# Patient Record
Sex: Female | Born: 1975 | Race: White | Hispanic: No | Marital: Married
Health system: Southern US, Community
[De-identification: ages and names within clinical notes are randomized; demographics above are authoritative.]

## PROBLEM LIST (undated history)

## (undated) DIAGNOSIS — J302 Other seasonal allergic rhinitis: Secondary | ICD-10-CM

## (undated) DIAGNOSIS — R55 Syncope and collapse: Secondary | ICD-10-CM

## (undated) HISTORY — DX: Syncope and collapse: R55

## (undated) HISTORY — DX: Other seasonal allergic rhinitis: J30.2

---

## 2004-06-22 ENCOUNTER — Other Ambulatory Visit: Admission: RE | Admit: 2004-06-22 | Discharge: 2004-06-22 | Payer: Self-pay | Admitting: Family Medicine

## 2005-07-10 ENCOUNTER — Other Ambulatory Visit: Admission: RE | Admit: 2005-07-10 | Discharge: 2005-07-10 | Payer: Self-pay | Admitting: Family Medicine

## 2007-06-12 ENCOUNTER — Ambulatory Visit (HOSPITAL_COMMUNITY): Admission: RE | Admit: 2007-06-12 | Discharge: 2007-06-12 | Payer: Self-pay | Admitting: Obstetrics and Gynecology

## 2007-07-21 ENCOUNTER — Inpatient Hospital Stay (HOSPITAL_COMMUNITY): Admission: AD | Admit: 2007-07-21 | Discharge: 2007-07-24 | Payer: Self-pay | Admitting: Obstetrics & Gynecology

## 2010-02-17 ENCOUNTER — Inpatient Hospital Stay (HOSPITAL_COMMUNITY): Admission: AD | Admit: 2010-02-17 | Discharge: 2010-02-19 | Payer: Self-pay | Admitting: Obstetrics & Gynecology

## 2010-12-24 LAB — CBC
HCT: 33.7 % — ABNORMAL LOW (ref 36.0–46.0)
Hemoglobin: 11.6 g/dL — ABNORMAL LOW (ref 12.0–15.0)
WBC: 10.2 10*3/uL (ref 4.0–10.5)

## 2010-12-25 LAB — CBC
HCT: 34.8 % — ABNORMAL LOW (ref 36.0–46.0)
Hemoglobin: 12.1 g/dL (ref 12.0–15.0)
MCHC: 34.7 g/dL (ref 30.0–36.0)
RDW: 13.3 % (ref 11.5–15.5)

## 2011-07-17 LAB — CBC
MCHC: 34
MCV: 94.1
Platelets: 237
RBC: 3.38 — ABNORMAL LOW

## 2011-07-18 LAB — CBC
MCV: 93.9
Platelets: 267
RDW: 13
WBC: 9.2

## 2011-07-18 LAB — RPR: RPR Ser Ql: NONREACTIVE

## 2019-11-06 ENCOUNTER — Other Ambulatory Visit: Payer: Self-pay | Admitting: Obstetrics & Gynecology

## 2019-11-06 DIAGNOSIS — Z1231 Encounter for screening mammogram for malignant neoplasm of breast: Secondary | ICD-10-CM

## 2019-11-26 ENCOUNTER — Ambulatory Visit: Payer: Self-pay | Attending: Internal Medicine

## 2019-11-26 DIAGNOSIS — Z23 Encounter for immunization: Secondary | ICD-10-CM | POA: Insufficient documentation

## 2019-11-26 NOTE — Progress Notes (Signed)
   Cov[facility] Vaccination [facility]  Name:  Carrie Colon    MRN: [id number] DOB: 1976/10/07  11/26/2019  Ms. [name] was observed post Covid-19 immunization for 15 minutes without incidence. She was provided with Vaccine Information Sheet and instruction to access the V-Safe system.   Ms. [name] was instructed to call 911 with any severe reactions post vaccine: Marland Kitchen Difficulty breathing  . Swelling of your face and throat  . A fast heartbeat  . A bad rash all over your body  . Dizziness and weakness    Immunizations Administered    Name Date Dose VIS Date Route   Pfizer COVID-19 Vaccine 11/26/2019  2:55 PM 0.3 mL 09/17/2019 Intramuscular   Manufacturer: [employer], [employer]   Lot: [id number]   NDC: [address]-6

## 2019-12-21 ENCOUNTER — Ambulatory Visit: Payer: Self-pay

## 2020-02-09 ENCOUNTER — Other Ambulatory Visit: Payer: Self-pay

## 2020-02-09 ENCOUNTER — Ambulatory Visit
Admission: RE | Admit: 2020-02-09 | Discharge: 2020-02-09 | Disposition: A | Discharge status: Home / Self Care | Payer: Federal, State, Local not specified - PPO | Source: Ambulatory Visit | Attending: Obstetrics & Gynecology | Admitting: Obstetrics & Gynecology

## 2020-02-09 DIAGNOSIS — Z1231 Encounter for screening mammogram for malignant neoplasm of breast: Secondary | ICD-10-CM

## 2021-07-14 IMAGING — MG DIGITAL SCREENING BILAT W/ TOMO W/ CAD
8 series · 8 of 24 positions shown · non-contrast
Comparison: Previous exam(s).

CLINICAL DATA: Screening.

EXAM:
DIGITAL SCREENING BILATERAL MAMMOGRAM WITH TOMO AND CAD

[L CC synth-2D]
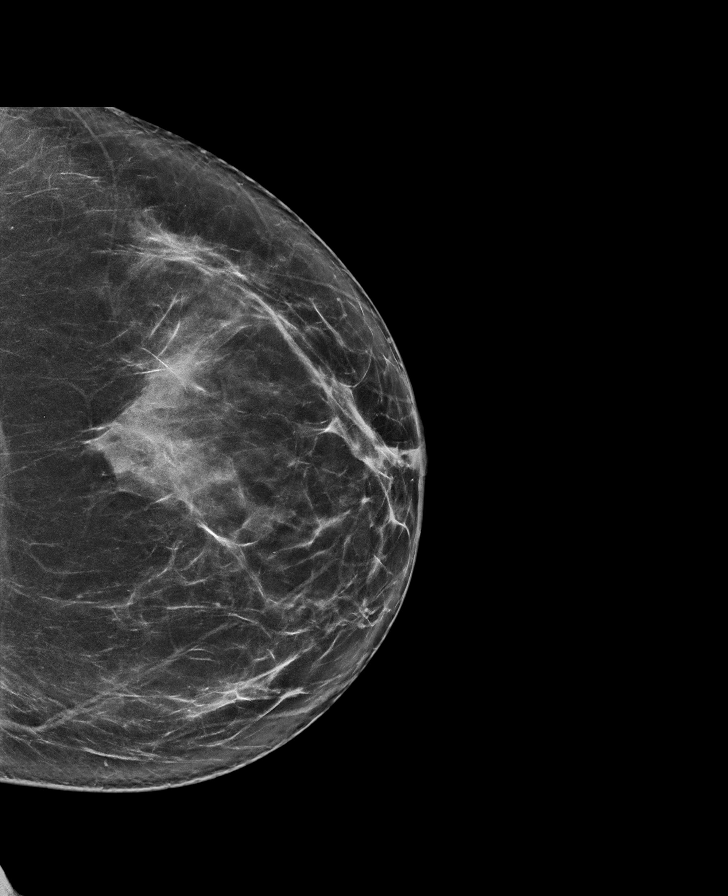

[R MLO synth-2D]
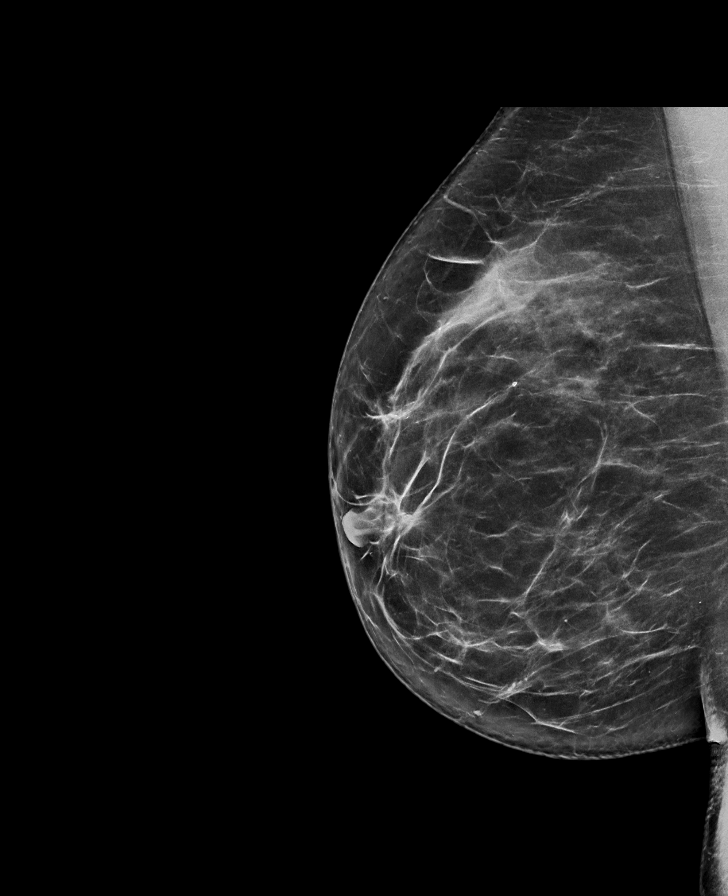

[L MLO synth-2D]
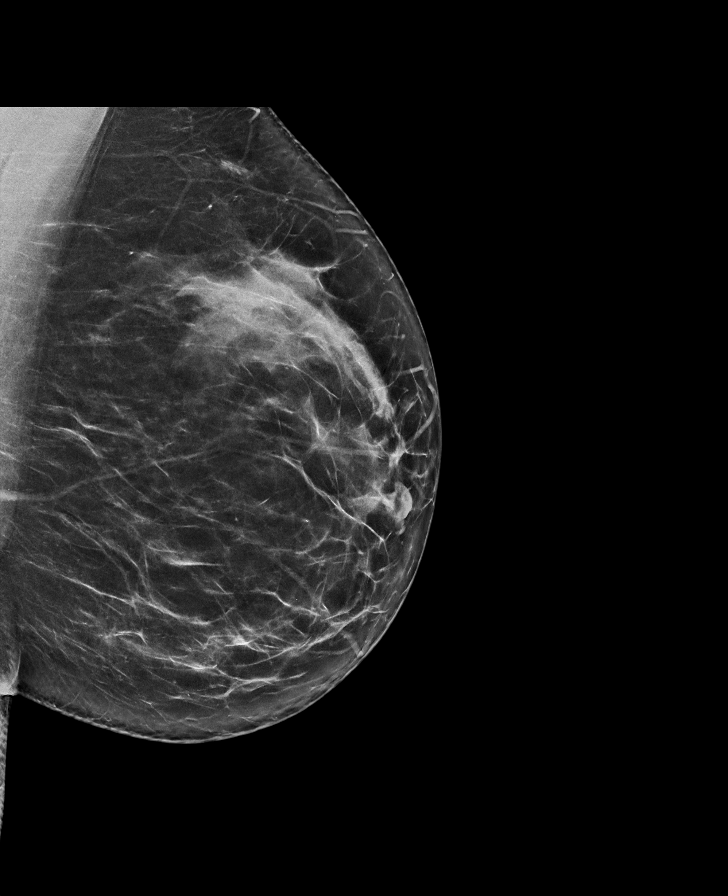

[R CC synth-2D]
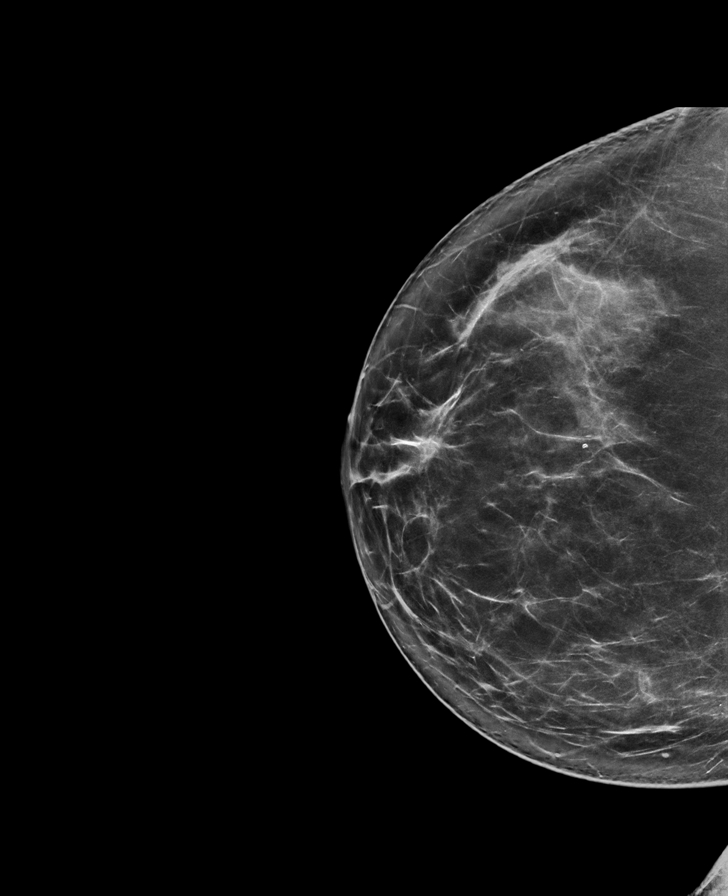

[L CC tomo · tomo slice 41/81.0]
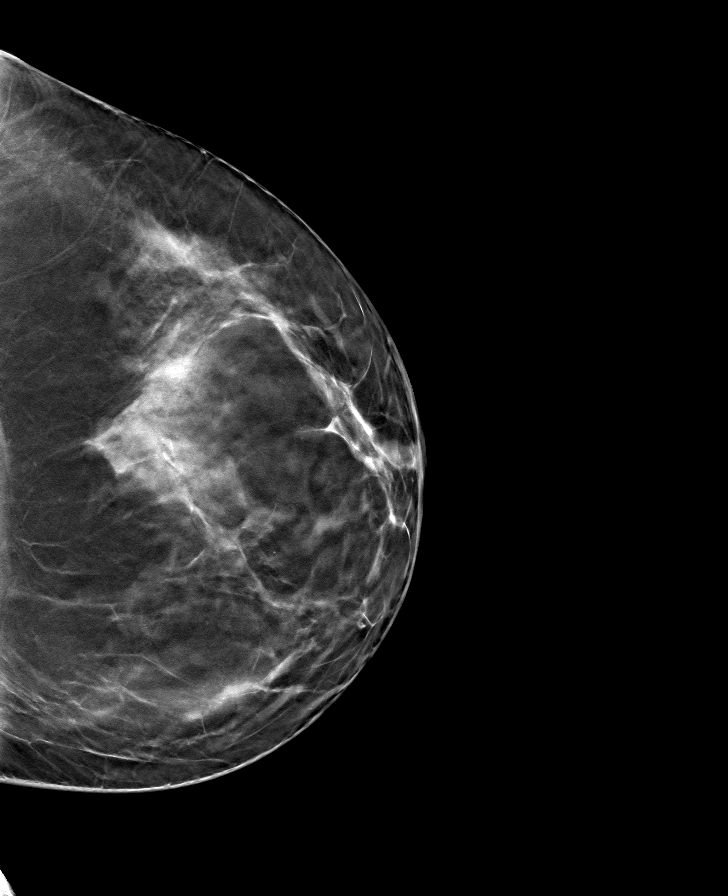

[L MLO tomo · tomo slice 41/82.0]
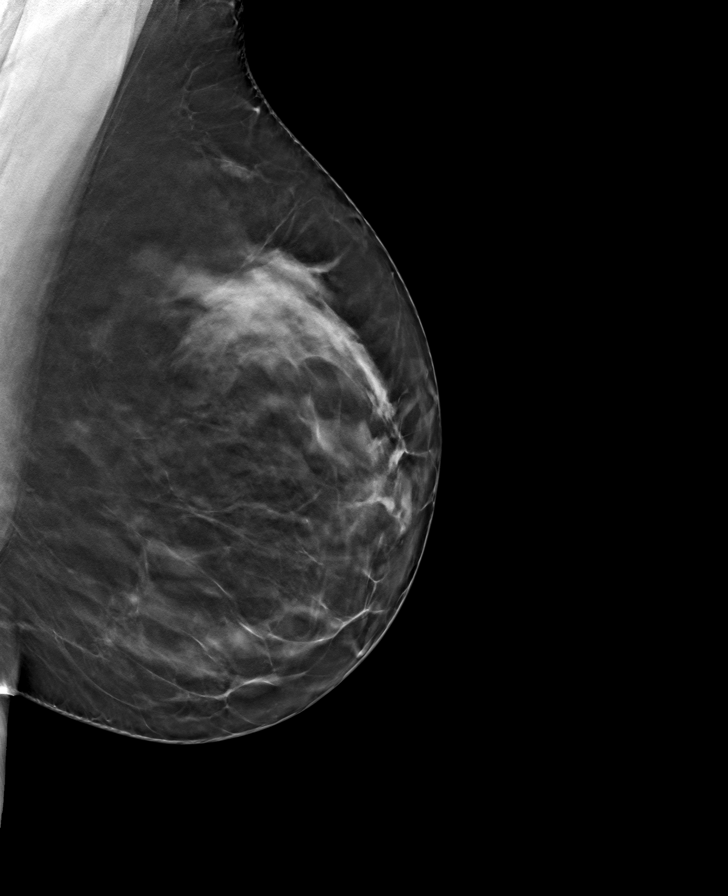

[R MLO tomo · tomo slice 42/83.0]
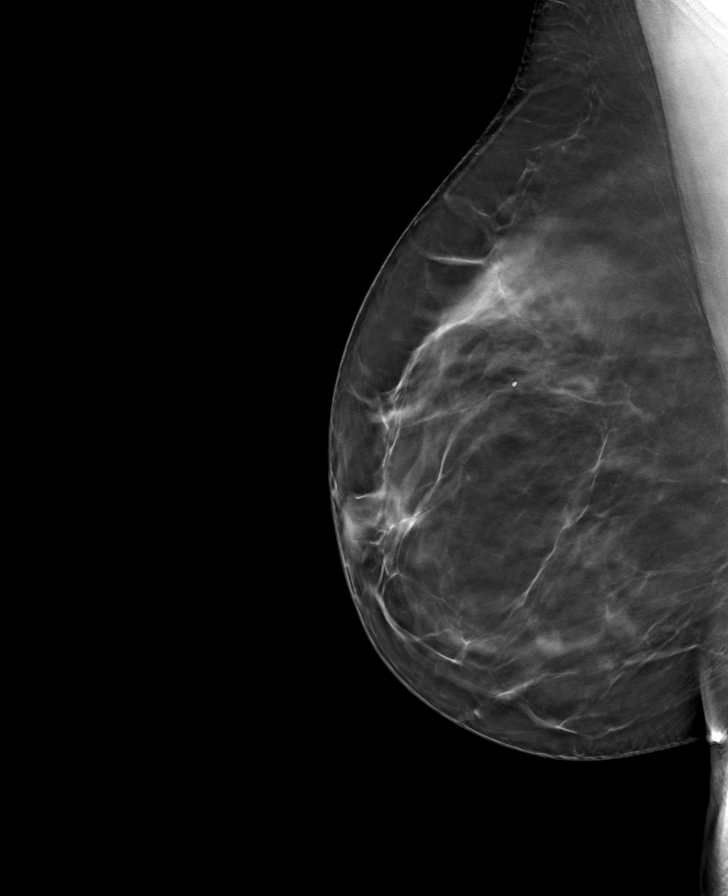

[R CC tomo · tomo slice 42/83.0]
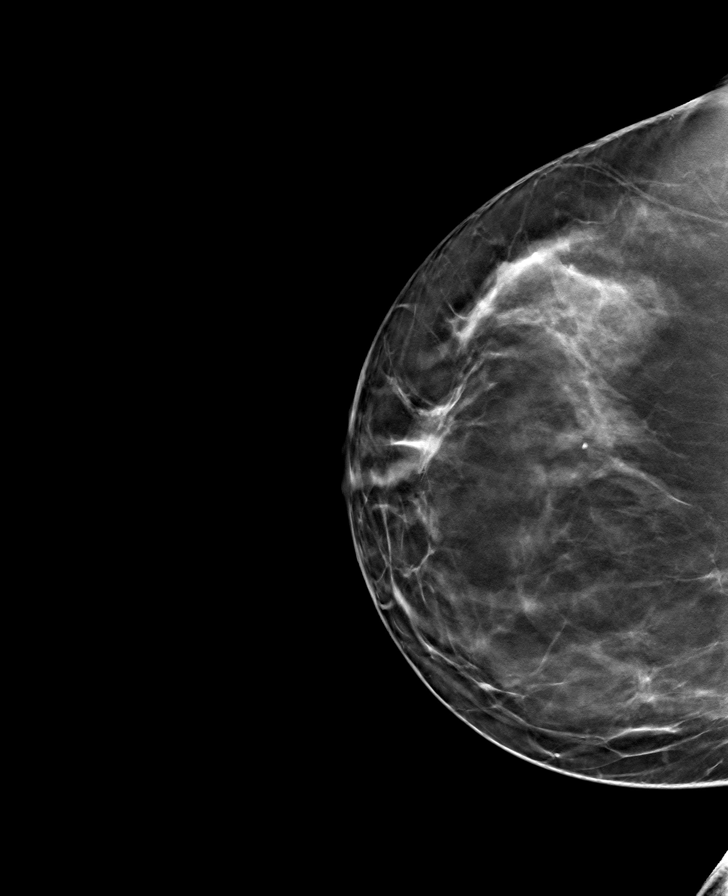

[8 of 24 positions shown; findings below may reference images not displayed]

ACR Breast Density Category c: The breast tissue is heterogeneously
dense, which may obscure small masses.
FINDINGS: There are no findings suspicious for malignancy. Images were
processed with CAD.
IMPRESSION: No mammographic evidence of malignancy. A result letter of this
screening mammogram will be mailed directly to the patient.

RECOMMENDATION:
Screening mammogram in one year. (Code:[id number])

BI-RADS CATEGORY  1: Negative.
